# Patient Record
Sex: Male | Born: 1960 | Race: White | Hispanic: No | Marital: Single | Smoking: Never smoker
Health system: Southern US, Community
[De-identification: ages and names within clinical notes are randomized; demographics above are authoritative.]

## PROBLEM LIST (undated history)

## (undated) DIAGNOSIS — N419 Inflammatory disease of prostate, unspecified: Secondary | ICD-10-CM

## (undated) DIAGNOSIS — D229 Melanocytic nevi, unspecified: Secondary | ICD-10-CM

## (undated) DIAGNOSIS — C439 Malignant melanoma of skin, unspecified: Secondary | ICD-10-CM

## (undated) HISTORY — DX: Malignant melanoma of skin, unspecified: C43.9

## (undated) HISTORY — DX: Melanocytic nevi, unspecified: D22.9

---

## 1998-09-01 ENCOUNTER — Emergency Department (HOSPITAL_COMMUNITY): Admission: EM | Admit: 1998-09-01 | Discharge: 1998-09-01 | Payer: Self-pay | Admitting: Emergency Medicine

## 1998-09-01 ENCOUNTER — Encounter: Payer: Self-pay | Admitting: Emergency Medicine

## 2000-05-28 ENCOUNTER — Encounter: Payer: Self-pay | Admitting: Family Medicine

## 2000-05-28 ENCOUNTER — Ambulatory Visit (HOSPITAL_COMMUNITY): Admission: RE | Admit: 2000-05-28 | Discharge: 2000-05-28 | Payer: Self-pay | Admitting: Family Medicine

## 2000-05-29 DIAGNOSIS — D229 Melanocytic nevi, unspecified: Secondary | ICD-10-CM

## 2000-05-29 HISTORY — DX: Melanocytic nevi, unspecified: D22.9

## 2001-01-09 ENCOUNTER — Encounter: Payer: Self-pay | Admitting: Family Medicine

## 2001-01-09 ENCOUNTER — Ambulatory Visit (HOSPITAL_COMMUNITY): Admission: RE | Admit: 2001-01-09 | Discharge: 2001-01-09 | Payer: Self-pay | Admitting: Family Medicine

## 2001-08-31 ENCOUNTER — Emergency Department (HOSPITAL_COMMUNITY): Admission: EM | Admit: 2001-08-31 | Discharge: 2001-09-01 | Payer: Self-pay

## 2002-10-07 DIAGNOSIS — C439 Malignant melanoma of skin, unspecified: Secondary | ICD-10-CM

## 2002-10-07 HISTORY — DX: Malignant melanoma of skin, unspecified: C43.9

## 2004-12-09 ENCOUNTER — Ambulatory Visit (HOSPITAL_COMMUNITY): Admission: RE | Admit: 2004-12-09 | Discharge: 2004-12-09 | Payer: Self-pay | Admitting: *Deleted

## 2013-02-25 ENCOUNTER — Emergency Department (HOSPITAL_COMMUNITY)
Admission: EM | Admit: 2013-02-25 | Discharge: 2013-02-25 | Disposition: A | Discharge status: Home / Self Care | Payer: BC Managed Care – PPO | Attending: Emergency Medicine | Admitting: Emergency Medicine

## 2013-02-25 ENCOUNTER — Encounter (HOSPITAL_COMMUNITY): Payer: Self-pay | Admitting: Emergency Medicine

## 2013-02-25 DIAGNOSIS — R319 Hematuria, unspecified: Secondary | ICD-10-CM | POA: Insufficient documentation

## 2013-02-25 DIAGNOSIS — D72829 Elevated white blood cell count, unspecified: Secondary | ICD-10-CM | POA: Insufficient documentation

## 2013-02-25 DIAGNOSIS — Z87438 Personal history of other diseases of male genital organs: Secondary | ICD-10-CM

## 2013-02-25 DIAGNOSIS — R109 Unspecified abdominal pain: Secondary | ICD-10-CM

## 2013-02-25 HISTORY — DX: Inflammatory disease of prostate, unspecified: N41.9

## 2013-02-25 LAB — URINALYSIS, ROUTINE W REFLEX MICROSCOPIC
Bilirubin Urine: NEGATIVE
Glucose, UA: NEGATIVE mg/dL
Ketones, ur: NEGATIVE mg/dL
Leukocytes, UA: NEGATIVE
Nitrite: NEGATIVE
Protein, ur: 30 mg/dL — AB
Specific Gravity, Urine: 1.024 (ref 1.005–1.030)
Urobilinogen, UA: 0.2 mg/dL (ref 0.0–1.0)
pH: 5 (ref 5.0–8.0)

## 2013-02-25 LAB — BASIC METABOLIC PANEL
CO2: 25 mEq/L (ref 19–32)
Calcium: 9.8 mg/dL (ref 8.4–10.5)
Chloride: 103 mEq/L (ref 96–112)
Creatinine, Ser: 0.86 mg/dL (ref 0.50–1.35)
Glucose, Bld: 130 mg/dL — ABNORMAL HIGH (ref 70–99)
Sodium: 137 mEq/L (ref 135–145)

## 2013-02-25 LAB — CBC WITH DIFFERENTIAL/PLATELET
Basophils Absolute: 0 10*3/uL (ref 0.0–0.1)
Eosinophils Relative: 0 % (ref 0–5)
HCT: 43.8 % (ref 39.0–52.0)
Hemoglobin: 15.2 g/dL (ref 13.0–17.0)
Lymphocytes Relative: 5 % — ABNORMAL LOW (ref 12–46)
Lymphs Abs: 0.6 10*3/uL — ABNORMAL LOW (ref 0.7–4.0)
MCHC: 34.7 g/dL (ref 30.0–36.0)
MCV: 90.7 fL (ref 78.0–100.0)
Monocytes Absolute: 0.7 10*3/uL (ref 0.1–1.0)
Monocytes Relative: 6 % (ref 3–12)
Neutro Abs: 10.4 10*3/uL — ABNORMAL HIGH (ref 1.7–7.7)
Neutrophils Relative %: 89 % — ABNORMAL HIGH (ref 43–77)
RBC: 4.83 MIL/uL (ref 4.22–5.81)
WBC: 11.8 10*3/uL — ABNORMAL HIGH (ref 4.0–10.5)

## 2013-02-25 LAB — URINE MICROSCOPIC-ADD ON

## 2013-02-25 MED ORDER — OXYCODONE-ACETAMINOPHEN 5-325 MG PO TABS: ORAL_TABLET | ORAL | Status: DC

## 2013-02-25 MED ORDER — PROMETHAZINE HCL 25 MG PO TABS: 25.0000 mg | ORAL_TABLET | Freq: Four times a day (QID) | ORAL | Status: DC | PRN

## 2013-02-25 NOTE — ED Provider Notes (Signed)
Medical screening examination/treatment/procedure(s) were performed by non-physician practitioner and as supervising physician I was immediately available for consultation/collaboration.   Gwyneth Sprout, MD 02/25/13 1020

## 2013-02-25 NOTE — ED Notes (Signed)
Pt states he had a sudden onset of back pain mainly on the right side  Pt states he has been treated for prostatitis and just finished his cipro on Thursday  Pt states he has not been able to get comfortable tonight and has had pressure like he needs to have a BM but is unable to do that  Pt states he does have some burning dull aching pain in his penis

## 2013-02-25 NOTE — ED Provider Notes (Signed)
CSN: [id number]     Arrival date & time 02/25/13  0350 History   First MD Initiated Contact with Patient 02/25/13 0446     Chief Complaint  Patient presents with  . Back Pain   (Consider location/radiation/quality/duration/timing/severity/associated sxs/prior Treatment) HPI  Spencer Allen is a 52 y.o. male complaining of acute onset of right flank pain that woke him from sleep early this morning. Pain lasted for several hours and has spontaneously resolved. Patient recently finished a 4 week course of Cipro for prostatitis. His doctor originally put him on a two-week course of Cipro for prostatitis approximately a year ago. Patient never seen a urologist. Patient denies fever, nausea vomiting, change in bowel habits, history of kidney stones, recent exertion or heavy lifting.. Patient does have some dysuria.   Past Medical History  Diagnosis Date  . Prostatitis    History reviewed. No pertinent past surgical history. History reviewed. No pertinent family history. History  Substance Use Topics  . Smoking status: Never Smoker   . Smokeless tobacco: Not on file  . Alcohol Use: Yes     Comment: moderate    Review of Systems  10 systems reviewed and found to be negative, except as noted in the HPI    Allergies  Review of patient's allergies indicates no known allergies.  Home Medications   Current Outpatient Rx  Name  Route  Sig  Dispense  Refill  . ibuprofen (ADVIL,MOTRIN) 200 MG tablet   Oral   Take 600 mg by mouth every 6 (six) hours as needed for pain.         Marland Kitchen oxyCODONE-acetaminophen (PERCOCET/ROXICET) 5-325 MG per tablet      1 to 2 tabs PO q6hrs  PRN for pain   15 tablet   0   . promethazine (PHENERGAN) 25 MG tablet   Oral   Take 1 tablet (25 mg total) by mouth every 6 (six) hours as needed for nausea.   12 tablet   0    BP 122/86  Pulse 72  Temp(Src) 98.5 F (36.9 C) (Oral)  Resp 16  Ht 5\' 10"  (1.778 m)  Wt 220 lb (99.791 kg)  BMI 31.57 kg/m2  SpO2  94% Physical Exam  Nursing note and vitals reviewed. Constitutional: He is oriented to person, place, and time. He appears well-developed and well-nourished. No distress.  HENT:  Head: Normocephalic.  Eyes: Conjunctivae and EOM are normal.  Cardiovascular: Normal rate, regular rhythm and intact distal pulses.   Pulmonary/Chest: Effort normal. No stridor. No respiratory distress. He has no wheezes. He has no rales. He exhibits no tenderness.  Abdominal: Soft. Bowel sounds are normal. He exhibits no distension and no mass. There is no tenderness. There is no rebound and no guarding.  Genitourinary:  No CVA tenderness to palpation bilaterally  Musculoskeletal: Normal range of motion.  No point tenderness to percussion of lumbar spinal processes.  No TTP or paraspinal spasm. Strength is 5 out of 5 to bilateral lower extremities at hip and knee;extensor hallucis longus 5 out of 5. Ankle strength 5 out of 5, no clonus, neurovascularly intact. Strait leg raise is negative bilaterally. No saddle anaesthesia     Neurological: He is alert and oriented to person, place, and time.  Psychiatric: He has a normal mood and affect.    ED Course  Procedures (including critical care time) Labs Review Labs Reviewed  URINALYSIS, ROUTINE W REFLEX MICROSCOPIC - Abnormal; Notable for the following:    APPearance CLOUDY (*)  Hgb urine dipstick LARGE (*)    Protein, ur 30 (*)    All other components within normal limits  URINE MICROSCOPIC-ADD ON - Abnormal; Notable for the following:    Bacteria, UA FEW (*)    Crystals CA OXALATE CRYSTALS (*)    All other components within normal limits  CBC WITH DIFFERENTIAL - Abnormal; Notable for the following:    WBC 11.8 (*)    Neutrophils Relative % 89 (*)    Neutro Abs 10.4 (*)    Lymphocytes Relative 5 (*)    Lymphs Abs 0.6 (*)    All other components within normal limits  BASIC METABOLIC PANEL - Abnormal; Notable for the following:    Glucose, Bld 130 (*)     All other components within normal limits   Imaging Review No results found.  EKG Interpretation   None       MDM   1. Right flank pain   2. Hematuria   3. H/O prostatitis     Filed Vitals:   02/25/13 0358 02/25/13 0757  BP: 153/89 122/86  Pulse: 65 72  Temp: 98.5 F (36.9 C)   TempSrc: Oral   Resp: 20 16  Height: 5\' 10"  (1.778 m)   Weight: 220 lb (99.791 kg)   SpO2: 98% 94%     Spencer Allen is a 52 y.o. male with right flank pain acute onset which woke him from sleep last night lasted several hours and resolved spontaneously. Urinalysis has hematuria and calcium oxalate crystals. Likely kidney stone which has passed. Patient is currently pain-free. He has also recently been treated with a 4 week course of Cipro for prostatitis. We have discussed whether or not to image for  hydronephrosis or kidney stone and we have deferred this in a shared visit decision-making capacity. Bloodwork shows no gross abnormalities: Mild leukocytosis. Normal renal function. Patient believes that he passed a stone while in the ED he said that he saw shards of some particular material when he urinated. Pain has completely resolved at this point patient is amenable to an appropriate for discharge at this point. Urology followup given. Discussed case with attending who agrees with plan and stability to d/c to home.    Pt is hemodynamically stable, appropriate for, and amenable to discharge at this time. Pt verbalized understanding and agrees with care plan. All questions answered. Outpatient follow-up and specific return precautions discussed.    New Prescriptions   OXYCODONE-ACETAMINOPHEN (PERCOCET/ROXICET) 5-325 MG PER TABLET    1 to 2 tabs PO q6hrs  PRN for pain   PROMETHAZINE (PHENERGAN) 25 MG TABLET    Take 1 tablet (25 mg total) by mouth every 6 (six) hours as needed for nausea.    Note: Portions of this report may have been transcribed using voice recognition software. Every effort was  made to ensure accuracy; however, inadvertent computerized transcription errors may be present      Wynetta Emery, PA-C 02/25/13 0813

## 2015-11-04 ENCOUNTER — Other Ambulatory Visit: Payer: Self-pay | Admitting: Orthopedic Surgery

## 2015-11-04 DIAGNOSIS — M25511 Pain in right shoulder: Secondary | ICD-10-CM

## 2015-11-17 ENCOUNTER — Ambulatory Visit
Admission: RE | Admit: 2015-11-17 | Discharge: 2015-11-17 | Disposition: A | Discharge status: Home / Self Care | Payer: BC Managed Care – PPO | Source: Ambulatory Visit | Attending: Orthopedic Surgery | Admitting: Orthopedic Surgery

## 2015-11-17 DIAGNOSIS — M25511 Pain in right shoulder: Secondary | ICD-10-CM

## 2017-03-16 IMAGING — MR MR SHOULDER*R* W/O CM
4 of 5 series · 19 of 40 positions shown · non-contrast
Comparison: None.

CLINICAL DATA: Right shoulder pain for more than 4 months. No known
injury. Initial encounter.

EXAM:
MRI OF THE RIGHT SHOULDER WITHOUT CONTRAST
TECHNIQUE: Multiplanar, multisequence MR imaging of the shoulder was performed.
No intravenous contrast was administered.

[Series 4: T2 fat-sat · axial · 4.0mm · 0.27mm/px · z∈[-7,+54]mm · 6 of 18 slices shown]
[im 1/18]
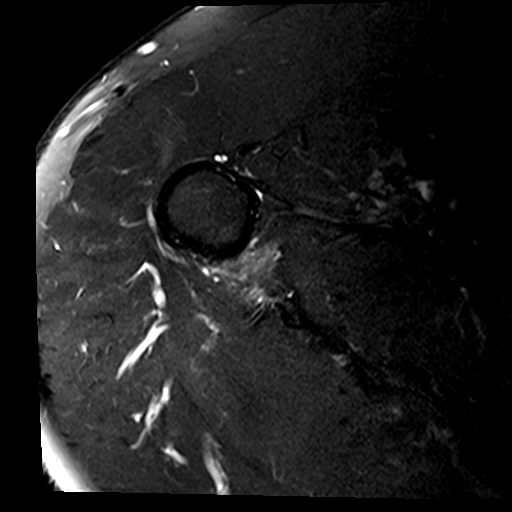
[im 3/18]
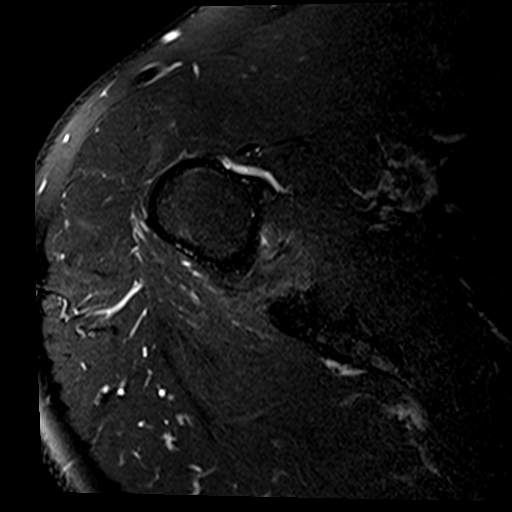
[im 5/18]
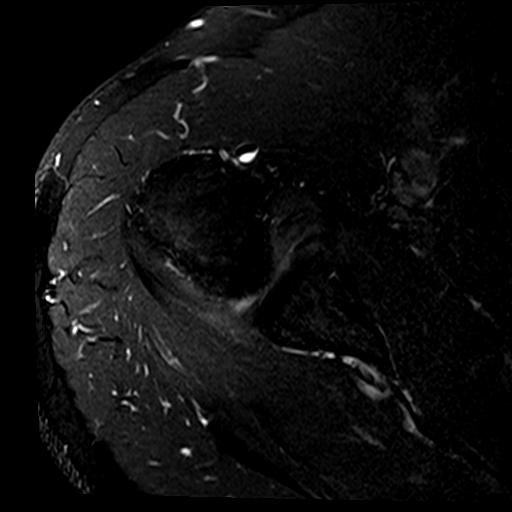
[im 7/18]
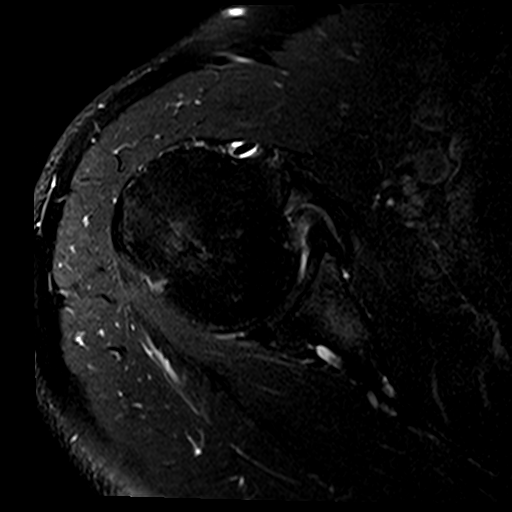
[im 9/18]
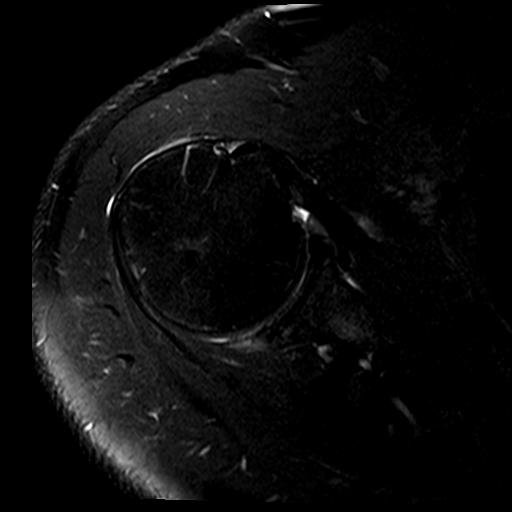
[im 15/18]
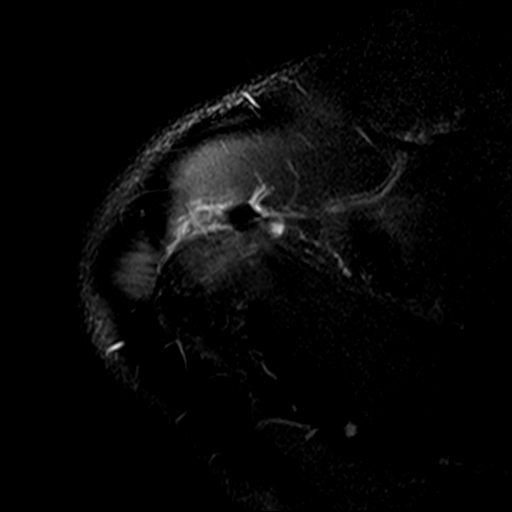

[Series 6: T2 · oblique · 4.0mm · 0.31mm/px · 3 of 16 slices shown]
[im 3/16]
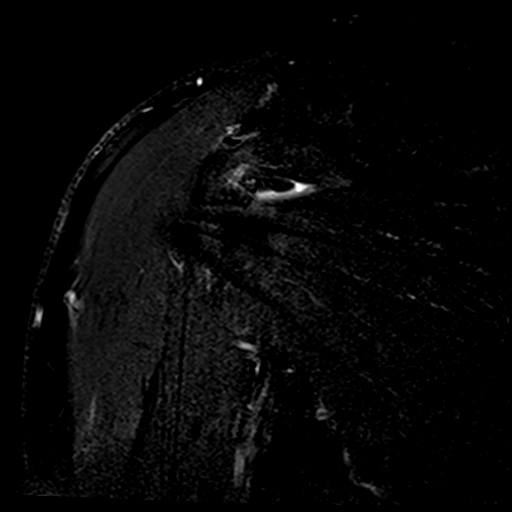
[im 9/16]
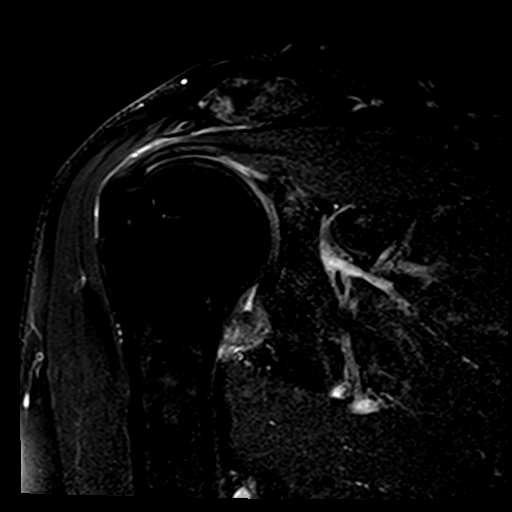
[im 13/16]
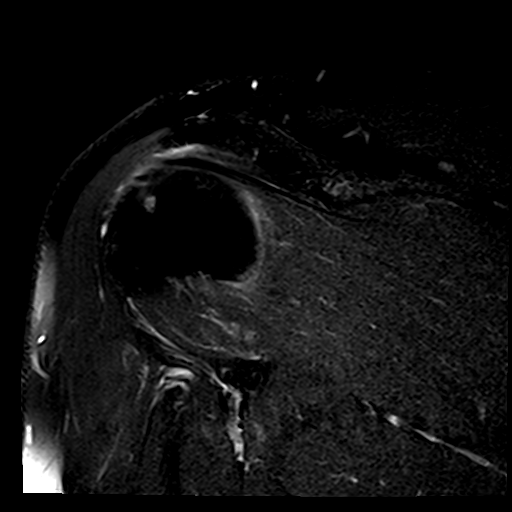

[Series 7: PD · oblique · 4.0mm · 0.31mm/px · 7 of 16 slices shown]
[im 1/16]
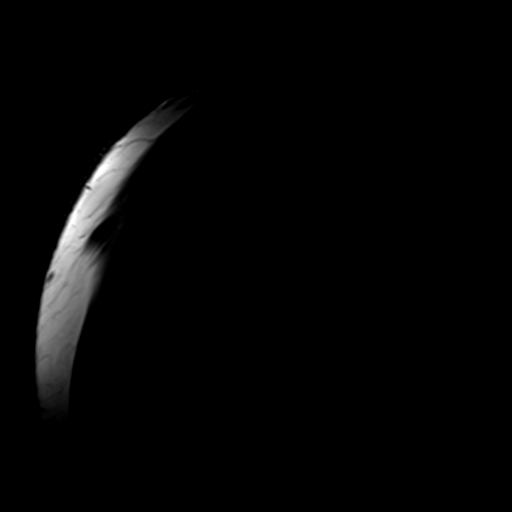
[im 3/16]
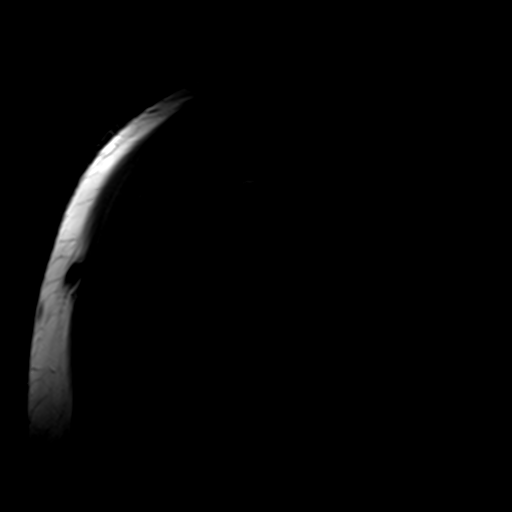
[im 6/16]
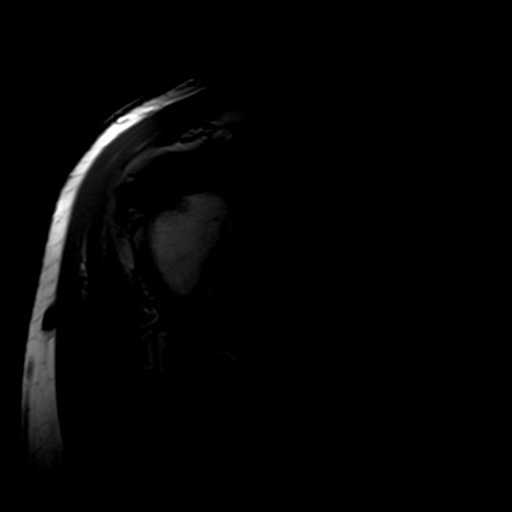
[im 8/16]
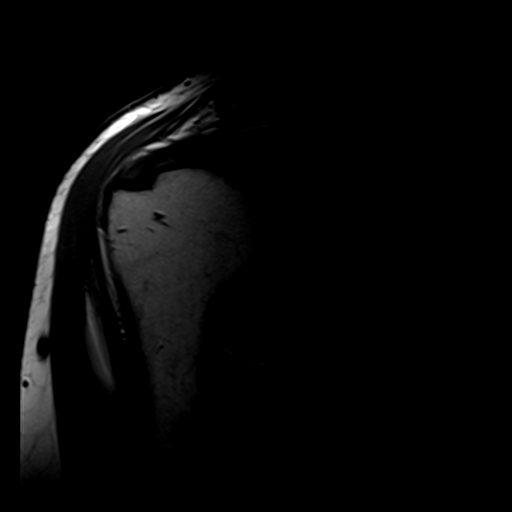
[im 11/16]
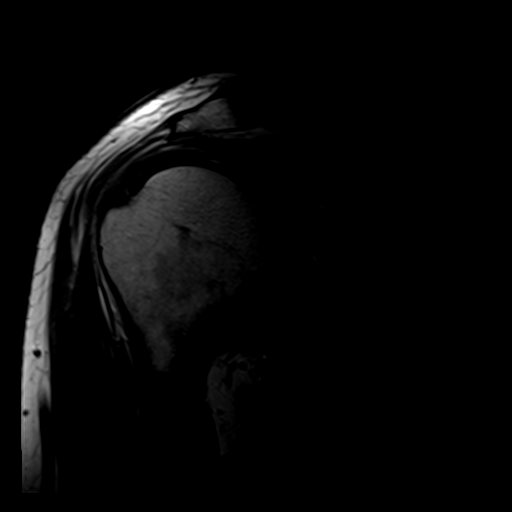
[im 13/16]
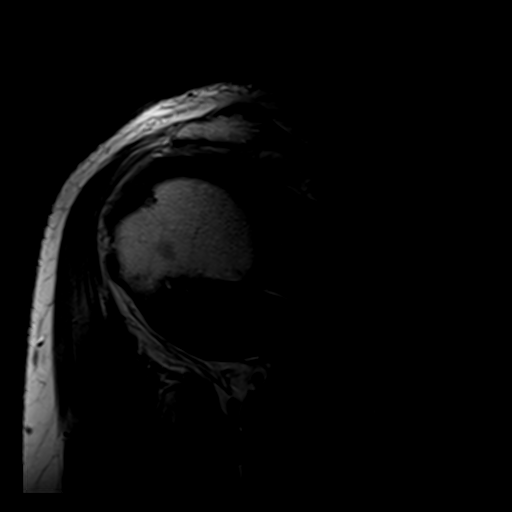
[im 16/16]
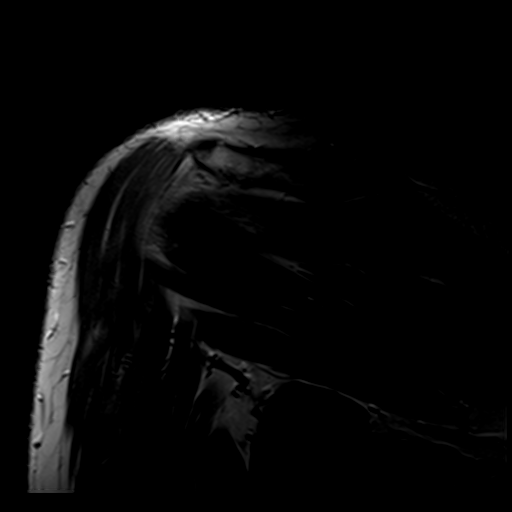

[Series 8: T1 · oblique · 4.0mm · 0.31mm/px · 3 of 18 slices shown]
[im 3/18]
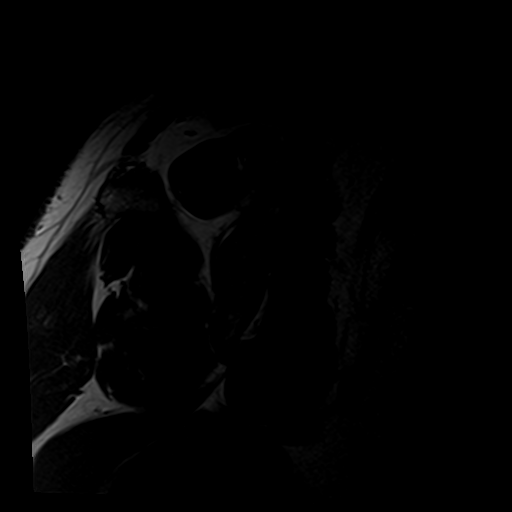
[im 10/18]
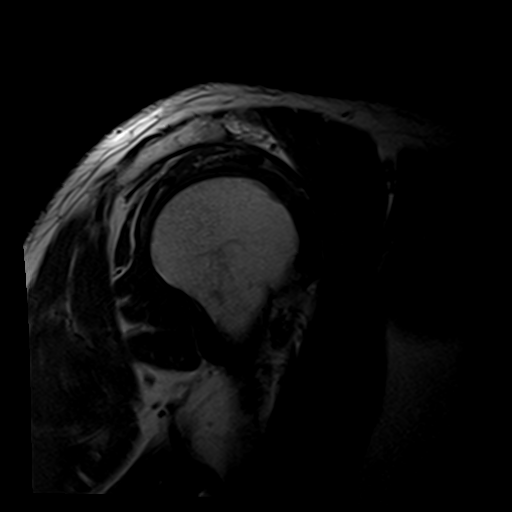
[im 15/18]
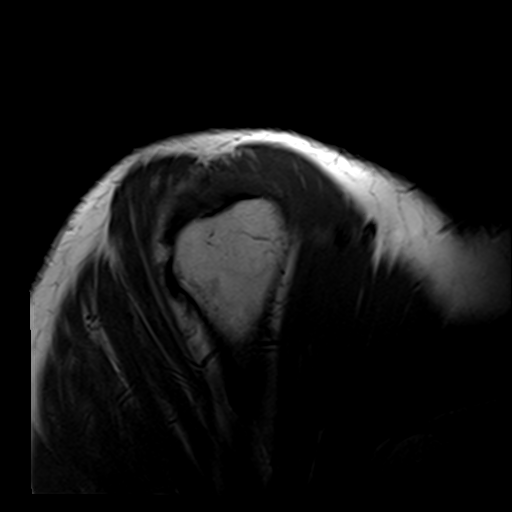

[19 of 40 positions shown; findings below may reference images not displayed]

FINDINGS: Rotator cuff: Rotator cuff tendinopathy is most notable in the
supraspinatus. No tear.

Muscles:  No atrophy or focal lesion.

Biceps long head: Intrasubstance increased T2 signal is seen within
the tendon in the bicipital interval consistent with tendinosis. No
tear.

Acromioclavicular Joint:  Mild degenerative change is present.

Glenohumeral Joint: The inferior glenohumeral ligament is thickened
with intermediate increased T2 signal consistent with adhesive
capsulitis.

Labrum:  Intact.

Bones: No fracture or worrisome marrow lesion. The acromion is type
2. A small volume of fluid is seen in the subacromial/subdeltoid
bursa.

Other: None
IMPRESSION: Rotator cuff tendinopathy most notable in the supraspinatus without
tear.

Tendinopathy of the intra-articular long head of biceps without
tear.

Findings consistent with adhesive capsulitis.

Mild acromioclavicular degenerative change.

Small volume of subacromial/subdeltoid fluid consistent with
bursitis.

## 2019-07-21 ENCOUNTER — Ambulatory Visit: Payer: BC Managed Care – PPO | Attending: Internal Medicine

## 2019-07-21 DIAGNOSIS — Z23 Encounter for immunization: Secondary | ICD-10-CM

## 2019-07-21 NOTE — Progress Notes (Signed)
   Covid-19 Vaccination Clinic  Name:  Spencer Allen    MRN: [id number] DOB: 12-Jun-1960  07/21/2019  Spencer Allen was observed post Covid-19 immunization for 15 minutes without incident. He was provided with Vaccine Information Sheet and instruction to access the V-Safe system.   Spencer Allen was instructed to call 911 with any severe reactions post vaccine: Marland Kitchen Difficulty breathing  . Swelling of face and throat  . A fast heartbeat  . A bad rash all over body  . Dizziness and weakness   Immunizations Administered    Name Date Dose VIS Date Route   Pfizer COVID-19 Vaccine 07/21/2019  1:24 PM 0.3 mL 04/18/2019 Intramuscular   Manufacturer: [place]   Lot: [id number]   [place]: [id number]

## 2019-08-12 ENCOUNTER — Ambulatory Visit: Payer: BC Managed Care – PPO

## 2019-08-12 ENCOUNTER — Ambulatory Visit: Payer: BC Managed Care – PPO | Attending: Internal Medicine

## 2019-08-12 DIAGNOSIS — Z23 Encounter for immunization: Secondary | ICD-10-CM

## 2019-08-12 NOTE — Progress Notes (Signed)
   Covid-19 Vaccination Clinic  Name:  Spencer Allen    MRN: [id number] DOB: [birth year]  08/12/2019  [name] was observed post Covid-19 immunization for 15 minutes without incident. He was provided with Vaccine Information Sheet and instruction to access the V-Safe system.   [name] was instructed to call 911 with any severe reactions post vaccine: Marland Kitchen Difficulty breathing  . Swelling of face and throat  . A fast heartbeat  . A bad rash all over body  . Dizziness and weakness   Immunizations Administered    Name Date Dose VIS Date Route   Pfizer COVID-19 Vaccine 08/12/2019  4:51 PM 0.3 mL 04/18/2019 Intramuscular   Manufacturer: [place]   Lot: [id number]   [place]: [id number]

## 2020-03-17 ENCOUNTER — Ambulatory Visit: Payer: BC Managed Care – PPO | Admitting: Dermatology

## 2020-03-17 ENCOUNTER — Other Ambulatory Visit: Payer: Self-pay

## 2020-03-17 DIAGNOSIS — L821 Other seborrheic keratosis: Secondary | ICD-10-CM

## 2020-03-17 DIAGNOSIS — Z86018 Personal history of other benign neoplasm: Secondary | ICD-10-CM | POA: Diagnosis not present

## 2020-03-17 DIAGNOSIS — Z1283 Encounter for screening for malignant neoplasm of skin: Secondary | ICD-10-CM | POA: Diagnosis not present

## 2020-03-17 DIAGNOSIS — L570 Actinic keratosis: Secondary | ICD-10-CM

## 2020-03-17 DIAGNOSIS — Z85820 Personal history of malignant melanoma of skin: Secondary | ICD-10-CM | POA: Diagnosis not present

## 2020-03-17 DIAGNOSIS — D485 Neoplasm of uncertain behavior of skin: Secondary | ICD-10-CM

## 2020-03-17 DIAGNOSIS — D1801 Hemangioma of skin and subcutaneous tissue: Secondary | ICD-10-CM

## 2020-03-17 DIAGNOSIS — D2239 Melanocytic nevi of other parts of face: Secondary | ICD-10-CM | POA: Diagnosis not present

## 2020-03-17 NOTE — Patient Instructions (Signed)

## 2020-03-30 ENCOUNTER — Ambulatory Visit: Payer: BC Managed Care – PPO | Attending: Internal Medicine

## 2020-03-30 DIAGNOSIS — Z23 Encounter for immunization: Secondary | ICD-10-CM

## 2020-03-30 NOTE — Progress Notes (Signed)
   Covid-19 Vaccination Clinic  Name:  Spencer Allen    MRN: [id number] DOB: 07-18-1960  03/30/2020  Mr. [name] was observed post Covid-19 immunization for 15 minutes without incident. He was provided with Vaccine Information Sheet and instruction to access the V-Safe system.   [name] was instructed to call 911 with any severe reactions post vaccine: Marland Kitchen Difficulty breathing  . Swelling of face and throat  . A fast heartbeat  . A bad rash all over body  . Dizziness and weakness   Immunizations Administered    Name Date Dose VIS Date Route   Pfizer COVID-19 Vaccine 03/30/2020  4:49 PM 0.3 mL 02/25/2020 Intramuscular   Manufacturer: [place]   Lot: [id number]   [place]: [id number]

## 2020-03-31 ENCOUNTER — Encounter: Payer: Self-pay | Admitting: Dermatology

## 2020-03-31 NOTE — Progress Notes (Signed)
° °  Follow-Up Visit   Subjective  Spencer Allen is a 59 y.o. male who presents for the following: Annual Exam (Patient here today for yearly skin check.  Per patient he does have a few crusty spots on his scalp he'd like looked at.).  General skin exam Location: Bump left cheek may be larger Duration:  Quality:  Associated Signs/Symptoms: Modifying Factors:  Severity:  Timing: Context:   Objective  Well appearing patient in no apparent distress; mood and affect are within normal limits.  A full examination was performed including scalp, head, eyes, ears, nose, lips, neck, chest, axillae, abdomen, back, buttocks, bilateral upper extremities, bilateral lower extremities, hands, feet, fingers, toes, fingernails, and toenails. All findings within normal limits unless otherwise noted below.   Assessment & Plan    Personal history of malignant melanoma of skin Right Thigh - Anterior  He will self examine his skin twice annually, dermatologist exam yearly  History of atypical skin mole Mid Back  Annual skin examination  Skin exam for malignant neoplasm Neck - Anterior  Yearly skin check  AK (actinic keratosis) Left Zygomatic Area  Destruction of lesion - Left Zygomatic Area Complexity: simple   Destruction method: cryotherapy   Informed consent: discussed and consent obtained   Timeout:  patient name, date of birth, surgical site, and procedure verified Lesion destroyed using liquid nitrogen: Yes   Cryotherapy cycles:  5 Outcome: patient tolerated procedure well with no complications   Post-procedure details: wound care instructions given    Neoplasm of uncertain behavior of skin Left Buccal Cheek   Skin / nail biopsy Type of biopsy: tangential   Informed consent: discussed and consent obtained   Timeout: patient name, date of birth, surgical site, and procedure verified   Procedure prep:  Patient was prepped and draped in usual sterile fashion (Non sterile) Prep  type:  Chlorhexidine Anesthesia: the lesion was anesthetized in a standard fashion   Anesthetic:  1% lidocaine w/ epinephrine 1-100,000 local infiltration Instrument used: flexible razor blade   Hemostasis achieved with: electrodesiccation   Outcome: patient tolerated procedure well   Post-procedure details: sterile dressing applied and wound care instructions given   Dressing type: bandage and petrolatum    Specimen 1 - Surgical pathology Differential Diagnosis: R/O BCC vs SCC Check Margins: No   Hemangioma of skin (3) Right Breast; Left Abdomen (side) - Lower; Left Temporal Scalp  Benign okay to leave  Seborrheic keratosis Left Axilla     I, Lavonna Monarch, MD, have reviewed all documentation for this visit.  The documentation on 03/31/20 for the exam, diagnosis, procedures, and orders are all accurate and complete.

## 2020-07-21 ENCOUNTER — Other Ambulatory Visit: Payer: Self-pay

## 2020-07-21 ENCOUNTER — Encounter (INDEPENDENT_AMBULATORY_CARE_PROVIDER_SITE_OTHER): Payer: Self-pay | Admitting: Ophthalmology

## 2020-07-21 ENCOUNTER — Ambulatory Visit (INDEPENDENT_AMBULATORY_CARE_PROVIDER_SITE_OTHER): Payer: BC Managed Care – PPO | Admitting: Ophthalmology

## 2020-07-21 DIAGNOSIS — H5213 Myopia, bilateral: Secondary | ICD-10-CM

## 2020-07-21 DIAGNOSIS — H43811 Vitreous degeneration, right eye: Secondary | ICD-10-CM

## 2020-07-21 DIAGNOSIS — H2513 Age-related nuclear cataract, bilateral: Secondary | ICD-10-CM

## 2020-07-21 DIAGNOSIS — H43812 Vitreous degeneration, left eye: Secondary | ICD-10-CM | POA: Diagnosis not present

## 2020-07-21 NOTE — Progress Notes (Signed)
07/21/2020     CHIEF COMPLAINT Patient presents for Flashes/floaters [facility] - new floaters OD//Pt c/o new floaters OD that come and go x 6 months approx. Pt sts he also sees a large floater that drifts across [place] OD. Pt denies flashes of light. Pt c/o dryness OU. Pt reports continued occasional floater OS. Pt reports recent difficulty getting contact lens out of OD.)   HISTORY OF PRESENT ILLNESS: Spencer Allen is a 60 y.o. male who presents to the clinic today for:   HPI    Flashes/floaters    In right eye.  This started 6 months ago.  Duration of 6 months.  Duration Intermittant.  Characterized as small, large and spots.  Since onset it is stable.  Associated Symptoms Floaters.  Treatments tried include no treatments. Additional comments: WIP - new floaters OD  Pt c/o new floaters OD that come and go x 6 months approx. Pt sts he also sees a large floater that drifts across [place] OD. Pt denies flashes of light. Pt c/o dryness OU. Pt reports continued occasional floater OS. Pt reports recent difficulty getting contact lens out of OD.       Last edited by Rockie Neighbours, [place] on 07/21/2020  2:54 PM. (History)      Referring physician: Antony Contras, MD [place],  [place] [id number]  HISTORICAL INFORMATION:   Selected notes from the MEDICAL RECORD NUMBER       CURRENT MEDICATIONS: Current Outpatient Medications (Ophthalmic Drugs)  Medication Sig  . RESTASIS 0.05 % ophthalmic emulsion 1 drop 2 (two) times daily.   No current facility-administered medications for this visit. (Ophthalmic Drugs)   Current Outpatient Medications (Other)  Medication Sig  . esomeprazole (NEXIUM) 20 MG capsule Take by mouth.  Marland Kitchen ibuprofen (ADVIL,MOTRIN) 200 MG tablet Take 600 mg by mouth every 6 (six) hours as needed for pain.  Marland Kitchen oxyCODONE-acetaminophen (PERCOCET/ROXICET) 5-325 MG per tablet 1 to 2 tabs PO q6hrs  PRN for pain  . promethazine (PHENERGAN) 25 MG tablet Take 1 tablet (25  mg total) by mouth every 6 (six) hours as needed for nausea.   No current facility-administered medications for this visit. (Other)      REVIEW OF SYSTEMS:    ALLERGIES No Known Allergies  PAST MEDICAL HISTORY Past Medical History:  Diagnosis Date  . Atypical nevus 11/26/2003   Left Mid Back - Slight to Moderate  . Atypical nevus 11/26/2003   Left Mid Back Inf. - Slight to Moderate  . Atypical nevus 11/28/2005   Mid Post Neck - Slight  . Atypical nevus 03/28/2005   Left Scapula - Moderate  . Atypical nevus 06/23/2004   Left Upper Thigh - Moderate  . Atypical nevus 05/29/2000   Back - Moderate  . Atypical nevus 06/28/2011   Right Lower Abdomen - Severe/Early Melanoma  . Atypical nevus 06/28/2011   Left Side Abdomen - Severe/Early Melanoma  . Atypical nevus 10/06/2013   Left Outer Back - Mild  . [place] level II melanoma ([place]) 10/07/2002   Right Front Thigh  . Prostatitis    History reviewed. No pertinent surgical history.  FAMILY HISTORY History reviewed. No pertinent family history.  SOCIAL HISTORY Social History   Tobacco Use  . Smoking status: Never Smoker  . Smokeless tobacco: Never Used  Substance Use Topics  . Alcohol use: Yes    Comment: moderate  . Drug use: No         OPHTHALMIC EXAM:  Base Eye Exam    Visual Acuity (ETDRS)      Right Left   Dist cc 20/20 20/20   Correction: Glasses       Tonometry (Tonopen, 2:54 PM)      Right Left   Pressure 15 15       Pupils      Pupils Dark Light Shape React APD   Right PERRL 6 5 Round Brisk None   Left PERRL 6 5 Round Brisk None       Visual Fields (Counting fingers)      Left Right    Full Full       Extraocular Movement      Right Left    Full Full       Neuro/Psych    Oriented x3: Yes   Mood/Affect: Normal       Dilation    Both eyes: 1.0% Mydriacyl, 2.5% Phenylephrine @ 2:58 PM        Slit Lamp and Fundus Exam    External Exam      Right Left   External Normal Normal        Slit Lamp Exam      Right Left   Lids/Lashes Normal Normal   Conjunctiva/Sclera White and quiet White and quiet   Cornea Clear Clear   Anterior Chamber Deep and quiet Deep and quiet   Iris Round and reactive Round and reactive   Lens 1+ Nuclear sclerosis 1+ Nuclear sclerosis   Anterior Vitreous Normal Normal       Fundus Exam      Right Left   Posterior Vitreous Posterior vitreous detachment, vit debris Posterior vitreous detachment   Disc Normal Normal   C/D Ratio 0.3 0.3   Macula Normal Normal   Vessels Normal Normal   Periphery Normal, no holes or tears Normal, no holes or tears          IMAGING AND PROCEDURES  Imaging and Procedures for 07/21/20  Color Fundus Photography Optos - OU - Both Eyes       Right Eye Progression has been stable. Disc findings include normal observations. Macula : normal observations. Vessels : normal observations. Periphery : normal observations.   Left Eye Progression has been stable. Disc findings include normal observations. Macula : normal observations. Vessels : normal observations. Periphery : normal observations.   Notes No appreciable vitreous membranes or strands, incidental posterior vitreous detachments are noted                ASSESSMENT/PLAN:  No problem-specific Assessment & Plan notes found for this encounter.      ICD-10-CM   1. 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 Posterior vitreous detachment of left eye  H43.812 Color Fundus Photography Optos - OU - Both Eyes  3. Posterior vitreous detachment of right eye  H43.811   4. Nuclear sclerotic cataract of both eyes  H25.13     1.  2.  3.  Ophthalmic Meds Ordered this visit:  No orders of the defined types were placed in this encounter.      Return in about 1 year (around 07/21/2021) for DILATE OU, COLOR FP.  There are no Patient Instructions on file for this visit.   Explained the diagnoses,  plan, and follow up with the patient and they expressed understanding.  Patient expressed understanding of the importance of proper follow up care.   Clent Demark Alverto Shedd M.D. Diseases &  Surgery of the Retina and Vitreous Retina [facility] Diabetic [place] 07/21/20     Abbreviations: M myopia (nearsighted); A astigmatism; H hyperopia (farsighted); P presbyopia; Mrx spectacle prescription;  CTL contact lenses; OD right eye; OS left eye; OU both eyes  XT exotropia; ET esotropia; PEK punctate epithelial keratitis; PEE punctate epithelial erosions; DES dry eye syndrome; MGD meibomian gland dysfunction; ATs artificial tears; PFAT's preservative free artificial tears; [place] nuclear sclerotic cataract; PSC posterior subcapsular cataract; ERM epi-retinal membrane; PVD posterior vitreous detachment; RD retinal detachment; DM diabetes mellitus; DR diabetic retinopathy; NPDR non-proliferative diabetic retinopathy; PDR proliferative diabetic retinopathy; CSME clinically significant macular edema; DME diabetic macular edema; dbh dot blot hemorrhages; CWS cotton wool spot; POAG primary open angle glaucoma; C/D cup-to-disc ratio; HVF humphrey visual field; GVF goldmann visual field; OCT optical coherence tomography; IOP intraocular pressure; BRVO Branch retinal vein occlusion; CRVO central retinal vein occlusion; CRAO central retinal artery occlusion; BRAO branch retinal artery occlusion; RT retinal tear; SB scleral buckle; PPV pars plana vitrectomy; VH Vitreous hemorrhage; PRP panretinal laser photocoagulation; IVK intravitreal kenalog; VMT vitreomacular traction; MH Macular hole;  NVD neovascularization of the disc; NVE neovascularization elsewhere; AREDS age related eye disease study; ARMD age related macular degeneration; POAG primary open angle glaucoma; EBMD epithelial/anterior basement membrane dystrophy; ACIOL anterior chamber intraocular lens; IOL intraocular lens; PCIOL posterior chamber intraocular lens; Phaco/IOL  phacoemulsification with intraocular lens placement; [place] photorefractive keratectomy; LASIK laser assisted in situ keratomileusis; HTN hypertension; DM diabetes mellitus; COPD chronic obstructive pulmonary disease

## 2021-03-21 ENCOUNTER — Ambulatory Visit: Payer: BC Managed Care – PPO | Admitting: Dermatology

## 2021-04-05 ENCOUNTER — Ambulatory Visit: Payer: BC Managed Care – PPO | Admitting: Dermatology

## 2021-04-05 ENCOUNTER — Other Ambulatory Visit: Payer: Self-pay

## 2021-04-05 ENCOUNTER — Encounter: Payer: Self-pay | Admitting: Dermatology

## 2021-04-05 DIAGNOSIS — D1801 Hemangioma of skin and subcutaneous tissue: Secondary | ICD-10-CM | POA: Diagnosis not present

## 2021-04-05 DIAGNOSIS — L821 Other seborrheic keratosis: Secondary | ICD-10-CM | POA: Diagnosis not present

## 2021-04-05 DIAGNOSIS — L570 Actinic keratosis: Secondary | ICD-10-CM | POA: Diagnosis not present

## 2021-04-05 DIAGNOSIS — Z1283 Encounter for screening for malignant neoplasm of skin: Secondary | ICD-10-CM

## 2021-04-05 DIAGNOSIS — Z85820 Personal history of malignant melanoma of skin: Secondary | ICD-10-CM

## 2021-04-05 DIAGNOSIS — D239 Other benign neoplasm of skin, unspecified: Secondary | ICD-10-CM

## 2021-04-05 DIAGNOSIS — D235 Other benign neoplasm of skin of trunk: Secondary | ICD-10-CM

## 2021-04-23 ENCOUNTER — Encounter: Payer: Self-pay | Admitting: Dermatology

## 2021-04-23 NOTE — Progress Notes (Signed)
° °  Follow-Up Visit   Subjective  Spencer Allen is a 60 y.o. male who presents for the following: Annual Exam (Here for annual skin exam. Concerns right lower leg itchy lesions. History of melanoma. ).  General skin examination Location:  Duration:  Quality:  Associated Signs/Symptoms: Modifying Factors:  Severity:  Timing: Context:   Objective  Well appearing patient in no apparent distress; mood and affect are within normal limits. Mid Back Full body skin exam.  No new or recurrent atypical pigmented lesions, no nonmelanoma skin cancer  Scalp 2 mm flap gritty crust, historically stable  Left Lower Leg - Posterior, Right Lower Leg - Posterior Multiple tan to brown flattopped textured 4 to 7 mm papules, all dermoscopy typical    A full examination was performed including scalp, head, eyes, ears, nose, lips, neck, chest, axillae, abdomen, back, buttocks, bilateral upper extremities, bilateral lower extremities, hands, feet, fingers, toes, fingernails, and toenails. All findings within normal limits unless otherwise noted below.   Assessment & Plan    Screening for malignant neoplasm of skin Mid Back  Yearly skin exams.  Encouraged to self examine twice annually.  Continued ultraviolet protection.  AK (actinic keratosis) Scalp  Intervention deferred will return if there is growth or other clinical change   Seborrheic keratosis (2) Left Lower Leg - Posterior; Right Lower Leg - Posterior  Benign no treatment needed.   Hemangioma of skin (2) Left Abdomen (side) - Upper; Mid Back  Dermatofibroma Right Lower Leg - Anterior  Benign no treatment needed.       I, Lavonna Monarch, MD, have reviewed all documentation for this visit.  The documentation on 04/23/21 for the exam, diagnosis, procedures, and orders are all accurate and complete.

## 2021-07-21 ENCOUNTER — Encounter (INDEPENDENT_AMBULATORY_CARE_PROVIDER_SITE_OTHER): Payer: BC Managed Care – PPO | Admitting: Ophthalmology

## 2022-04-05 ENCOUNTER — Ambulatory Visit: Payer: BC Managed Care – PPO | Admitting: Dermatology

## 2022-04-13 DIAGNOSIS — Z0000 Encounter for general adult medical examination without abnormal findings: Secondary | ICD-10-CM | POA: Diagnosis not present

## 2022-04-13 DIAGNOSIS — Z125 Encounter for screening for malignant neoplasm of prostate: Secondary | ICD-10-CM | POA: Diagnosis not present

## 2022-04-13 DIAGNOSIS — E7800 Pure hypercholesterolemia, unspecified: Secondary | ICD-10-CM | POA: Diagnosis not present

## 2022-04-13 DIAGNOSIS — R7303 Prediabetes: Secondary | ICD-10-CM | POA: Diagnosis not present

## 2022-04-17 DIAGNOSIS — K2270 Barrett's esophagus without dysplasia: Secondary | ICD-10-CM | POA: Diagnosis not present

## 2022-04-17 DIAGNOSIS — K219 Gastro-esophageal reflux disease without esophagitis: Secondary | ICD-10-CM | POA: Diagnosis not present

## 2022-11-14 DIAGNOSIS — R42 Dizziness and giddiness: Secondary | ICD-10-CM | POA: Diagnosis not present

## 2022-11-14 DIAGNOSIS — R030 Elevated blood-pressure reading, without diagnosis of hypertension: Secondary | ICD-10-CM | POA: Diagnosis not present

## 2022-12-08 DIAGNOSIS — N200 Calculus of kidney: Secondary | ICD-10-CM | POA: Diagnosis not present

## 2022-12-08 DIAGNOSIS — K429 Umbilical hernia without obstruction or gangrene: Secondary | ICD-10-CM | POA: Diagnosis not present

## 2022-12-08 DIAGNOSIS — N281 Cyst of kidney, acquired: Secondary | ICD-10-CM | POA: Diagnosis not present

## 2023-05-15 DIAGNOSIS — R7303 Prediabetes: Secondary | ICD-10-CM | POA: Diagnosis not present

## 2023-05-15 DIAGNOSIS — E7800 Pure hypercholesterolemia, unspecified: Secondary | ICD-10-CM | POA: Diagnosis not present

## 2023-05-15 DIAGNOSIS — H2513 Age-related nuclear cataract, bilateral: Secondary | ICD-10-CM | POA: Diagnosis not present

## 2023-05-15 DIAGNOSIS — Z125 Encounter for screening for malignant neoplasm of prostate: Secondary | ICD-10-CM | POA: Diagnosis not present

## 2023-05-15 DIAGNOSIS — Z23 Encounter for immunization: Secondary | ICD-10-CM | POA: Diagnosis not present

## 2023-05-15 DIAGNOSIS — H43813 Vitreous degeneration, bilateral: Secondary | ICD-10-CM | POA: Diagnosis not present

## 2023-05-15 DIAGNOSIS — H43821 Vitreomacular adhesion, right eye: Secondary | ICD-10-CM | POA: Diagnosis not present

## 2023-05-15 DIAGNOSIS — R030 Elevated blood-pressure reading, without diagnosis of hypertension: Secondary | ICD-10-CM | POA: Diagnosis not present

## 2023-05-15 DIAGNOSIS — Z0000 Encounter for general adult medical examination without abnormal findings: Secondary | ICD-10-CM | POA: Diagnosis not present

## 2023-05-18 DIAGNOSIS — Z85820 Personal history of malignant melanoma of skin: Secondary | ICD-10-CM | POA: Diagnosis not present

## 2023-05-18 DIAGNOSIS — D229 Melanocytic nevi, unspecified: Secondary | ICD-10-CM | POA: Diagnosis not present

## 2023-05-18 DIAGNOSIS — L578 Other skin changes due to chronic exposure to nonionizing radiation: Secondary | ICD-10-CM | POA: Diagnosis not present

## 2023-05-24 DIAGNOSIS — N200 Calculus of kidney: Secondary | ICD-10-CM | POA: Diagnosis not present

## 2023-05-24 DIAGNOSIS — R350 Frequency of micturition: Secondary | ICD-10-CM | POA: Diagnosis not present

## 2023-07-13 DIAGNOSIS — K219 Gastro-esophageal reflux disease without esophagitis: Secondary | ICD-10-CM | POA: Diagnosis not present

## 2023-07-13 DIAGNOSIS — Z860100 Personal history of colon polyps, unspecified: Secondary | ICD-10-CM | POA: Diagnosis not present

## 2023-07-13 DIAGNOSIS — K2270 Barrett's esophagus without dysplasia: Secondary | ICD-10-CM | POA: Diagnosis not present

## 2023-10-03 DIAGNOSIS — M25562 Pain in left knee: Secondary | ICD-10-CM | POA: Diagnosis not present

## 2023-10-11 DIAGNOSIS — M948X6 Other specified disorders of cartilage, lower leg: Secondary | ICD-10-CM | POA: Diagnosis not present

## 2023-10-11 DIAGNOSIS — M1712 Unilateral primary osteoarthritis, left knee: Secondary | ICD-10-CM | POA: Diagnosis not present

## 2023-11-05 DIAGNOSIS — K2270 Barrett's esophagus without dysplasia: Secondary | ICD-10-CM | POA: Diagnosis not present

## 2023-11-05 DIAGNOSIS — K449 Diaphragmatic hernia without obstruction or gangrene: Secondary | ICD-10-CM | POA: Diagnosis not present

## 2023-11-05 DIAGNOSIS — Z13810 Encounter for screening for upper gastrointestinal disorder: Secondary | ICD-10-CM | POA: Diagnosis not present

## 2023-11-08 DIAGNOSIS — M25562 Pain in left knee: Secondary | ICD-10-CM | POA: Diagnosis not present

## 2024-05-23 ENCOUNTER — Other Ambulatory Visit (HOSPITAL_COMMUNITY): Payer: Self-pay | Admitting: Family Medicine

## 2024-05-23 DIAGNOSIS — E7800 Pure hypercholesterolemia, unspecified: Secondary | ICD-10-CM

## 2024-06-05 ENCOUNTER — Ambulatory Visit
Admission: RE | Admit: 2024-06-05 | Discharge: 2024-06-05 | Disposition: A | Discharge status: Home / Self Care | Payer: Self-pay | Source: Ambulatory Visit | Attending: Family Medicine | Admitting: Family Medicine

## 2024-06-05 DIAGNOSIS — E7800 Pure hypercholesterolemia, unspecified: Secondary | ICD-10-CM | POA: Insufficient documentation
